# Patient Record
Sex: Male | Born: 1975 | Race: White | Hispanic: No | Marital: Single
Health system: Southern US, Community
[De-identification: ages and names within clinical notes are randomized; demographics above are authoritative.]

## PROBLEM LIST (undated history)

## (undated) HISTORY — PX: CHOLECYSTECTOMY: SHX55

---

## 2021-04-01 ENCOUNTER — Encounter (HOSPITAL_COMMUNITY): Payer: Self-pay | Admitting: Emergency Medicine

## 2021-04-01 ENCOUNTER — Other Ambulatory Visit: Payer: Self-pay

## 2021-04-01 ENCOUNTER — Emergency Department (HOSPITAL_COMMUNITY)
Admission: EM | Admit: 2021-04-01 | Discharge: 2021-04-02 | Disposition: A | Discharge status: Home / Self Care | Payer: BC Managed Care – PPO | Attending: Emergency Medicine | Admitting: Emergency Medicine

## 2021-04-01 ENCOUNTER — Emergency Department (HOSPITAL_COMMUNITY): Payer: BC Managed Care – PPO

## 2021-04-01 DIAGNOSIS — K529 Noninfective gastroenteritis and colitis, unspecified: Secondary | ICD-10-CM | POA: Diagnosis not present

## 2021-04-01 DIAGNOSIS — R739 Hyperglycemia, unspecified: Secondary | ICD-10-CM | POA: Insufficient documentation

## 2021-04-01 DIAGNOSIS — E860 Dehydration: Secondary | ICD-10-CM | POA: Insufficient documentation

## 2021-04-01 DIAGNOSIS — R109 Unspecified abdominal pain: Secondary | ICD-10-CM | POA: Diagnosis present

## 2021-04-01 DIAGNOSIS — E876 Hypokalemia: Secondary | ICD-10-CM | POA: Diagnosis not present

## 2021-04-01 DIAGNOSIS — R000 Tachycardia, unspecified: Secondary | ICD-10-CM | POA: Insufficient documentation

## 2021-04-01 LAB — CBC
HCT: 42.4 % (ref 39.0–52.0)
Hemoglobin: 14.9 g/dL (ref 13.0–17.0)
MCH: 31.6 pg (ref 26.0–34.0)
MCHC: 35.1 g/dL (ref 30.0–36.0)
MCV: 90 fL (ref 80.0–100.0)
Platelets: 303 10*3/uL (ref 150–400)
RBC: 4.71 MIL/uL (ref 4.22–5.81)
RDW: 12.2 % (ref 11.5–15.5)
WBC: 20.2 10*3/uL — ABNORMAL HIGH (ref 4.0–10.5)
nRBC: 0 % (ref 0.0–0.2)

## 2021-04-01 LAB — COMPREHENSIVE METABOLIC PANEL
ALT: 22 U/L (ref 0–44)
AST: 27 U/L (ref 15–41)
Albumin: 4.5 g/dL (ref 3.5–5.0)
Alkaline Phosphatase: 65 U/L (ref 38–126)
Anion gap: 15 (ref 5–15)
BUN: 24 mg/dL — ABNORMAL HIGH (ref 6–20)
CO2: 14 mmol/L — ABNORMAL LOW (ref 22–32)
Calcium: 9.2 mg/dL (ref 8.9–10.3)
Chloride: 108 mmol/L (ref 98–111)
Creatinine, Ser: 1.41 mg/dL — ABNORMAL HIGH (ref 0.61–1.24)
GFR, Estimated: >60 mL/min (ref >60)
Glucose, Bld: 422 mg/dL — ABNORMAL HIGH (ref 70–99)
Potassium: 4.2 mmol/L (ref 3.5–5.1)
Sodium: 137 mmol/L (ref 135–145)
Total Bilirubin: 1.5 mg/dL — ABNORMAL HIGH (ref 0.3–1.2)
Total Protein: 8.3 g/dL — ABNORMAL HIGH (ref 6.5–8.1)

## 2021-04-01 LAB — LIPASE, BLOOD: Lipase: 33 U/L (ref 11–51)

## 2021-04-01 MED ORDER — INSULIN ASPART 100 UNIT/ML IJ SOLN
10.0000 [IU] | Freq: Once | INTRAMUSCULAR | Status: AC
  Administered 2021-04-01: 10 [IU] via SUBCUTANEOUS
  Filled 2021-04-01: qty 0.1

## 2021-04-01 MED ORDER — SODIUM CHLORIDE 0.9 % IV BOLUS
1000.0000 mL | Freq: Once | INTRAVENOUS | Status: AC
  Administered 2021-04-01: 1000 mL via INTRAVENOUS

## 2021-04-01 MED ORDER — MORPHINE SULFATE (PF) 4 MG/ML IV SOLN
4.0000 mg | Freq: Once | INTRAVENOUS | Status: AC
  Administered 2021-04-01: 4 mg via INTRAVENOUS
  Filled 2021-04-01: qty 1

## 2021-04-01 MED ORDER — ONDANSETRON 4 MG PO TBDP
4.0000 mg | ORAL_TABLET | Freq: Once | ORAL | Status: AC
  Administered 2021-04-01: 4 mg via ORAL
  Filled 2021-04-01: qty 1

## 2021-04-01 MED ORDER — IOHEXOL 300 MG/ML  SOLN
100.0000 mL | Freq: Once | INTRAMUSCULAR | Status: AC | PRN
  Administered 2021-04-01: 100 mL via INTRAVENOUS

## 2021-04-01 NOTE — ED Triage Notes (Addendum)
Arrives via EMS from home, reports abdominal pain around umbilicus since 7am, emesis since 9am, has progressed throughout the day. HTN and tachy w/ EMS upon presentation. Hx of bowel obstructions. Pt reports hx of cannabis hyperemesis, last smoked 2 days ago.   20 G LFA, EMS gave 800 cc NS en route. 4 mg Zofran IV, 200 mcg Fentanyl, BP improved to 150/90.

## 2021-04-01 NOTE — ED Provider Triage Note (Signed)
Emergency Medicine Provider Triage Evaluation Note  Roger Walters , a 46 y.o. adult  was evaluated in triage.  Pt complains of lower left quadrant abdominal pain described as cramping as well as nausea and vomiting started overnight and has been worsening throughout the day.  Patient has a history of cannabinoid hyperemesis syndrome, but he states that he has not had symptoms from his CHS since he discontinued Ozempic.  He last used marijuana 2 days ago, however this does not feel like his typical flareup of CHS. patient tried hot showers which did not relieve his symptoms.  He is feeling very dehydrated.    Review of Systems  Positive: Abdominal pain, nausea, vomiting Negative: Diarrhea  Physical Exam  BP (!) 148/92 (BP Location: Left Arm)    Pulse 95    Temp 98.4 F (36.9 C) (Oral)    Resp 18    Ht 5\' 9"  (1.753 m)    Wt 93 kg    SpO2 95%    BMI 30.27 kg/m  Gen:   Awake, no distress   Resp:  Normal effort lungs CTA bilaterally MSK:   Moves extremities without difficulty  Other:    Medical Decision Making  Medically screening exam initiated at 7:04 PM.  Appropriate orders placed.  Geroge Gilliam was informed that the remainder of the evaluation will be completed by another provider, this initial triage assessment does not replace that evaluation, and the importance of remaining in the ED until their evaluation is complete.     Earnestine Mealing, Janell Quiet 04/01/21 1907

## 2021-04-01 NOTE — ED Provider Notes (Signed)
[facility] DEPT Provider Note   CSN: [id number] Arrival date & time: 04/01/21  1831     History  Chief Complaint  Patient presents with   Abdominal Pain   Emesis    Eliseo Vandeputte is a 46 y.o. adult.  The history is provided by the patient, the EMS personnel and medical records. No language interpreter was used.  Abdominal Pain Associated symptoms: vomiting   Emesis Associated symptoms: abdominal pain    46 year old male to male patient with significant history of marijuana abuse, prior cholecystectomy, brought here via EMS from home with complaints of abdominal pain.  Patient Dors severe abdominal pain that started early this morning.  Pain is sharp throbbing achy and crampy in his mid abdomen and has spread throughout the abdomen.  Pain associate with nausea vomiting and diarrhea.  Patient states he feels really dry.  No fever chills no chest pain shortness of breath no dysuria.  Does have history of marijuana abuse and last marijuana use was 2 days ago.  In the triage note mentioned that patient has history of small bowel obstruction but patient denies.  He still has an intact appendix.  EMS did provide patient with Zofran and 200 mcg of fentanyl prior to arrival as well as 800 cc of normal saline.  Home Medications Prior to Admission medications   Not on File      Allergies    Penicillins    Review of Systems   Review of Systems  Gastrointestinal:  Positive for abdominal pain and vomiting.  All other systems reviewed and are negative.  Physical Exam Updated Vital Signs BP (!) 189/126    Pulse (!) 118    Temp 98.4 F (36.9 C) (Oral)    Resp 20    Ht 5\' 9"  (1.753 m)    Wt 93 kg    SpO2 98%    BMI 30.27 kg/m  Physical Exam Constitutional:      Appearance: She is well-developed.     Comments: Patient appears uncomfortable, writhing around in bed requesting for something to drink.  HENT:     Head: Atraumatic.     Mouth/Throat:      Mouth: Mucous membranes are dry.  Eyes:     Conjunctiva/sclera: Conjunctivae normal.  Cardiovascular:     Rate and Rhythm: Tachycardia present.     Pulses: Normal pulses.     Heart sounds: Normal heart sounds.  Pulmonary:     Effort: Pulmonary effort is normal.     Breath sounds: Normal breath sounds.  Abdominal:     Tenderness: There is abdominal tenderness (Diffuse abdominal tenderness, no significant pain at McBurney's point).  Musculoskeletal:     Cervical back: Normal range of motion and neck supple.  Skin:    Findings: No rash.  Neurological:     Mental Status: She is alert. Mental status is at baseline.    ED Results / Procedures / Treatments   Labs (all labs ordered are listed, but only abnormal results are displayed) Labs Reviewed  COMPREHENSIVE METABOLIC PANEL - Abnormal; Notable for the following components:      Result Value   CO2 14 (*)    Glucose, Bld 422 (*)    BUN 24 (*)    Creatinine, Ser 1.41 (*)    Total Protein 8.3 (*)    Total Bilirubin 1.5 (*)    All other components within normal limits  CBC - Abnormal; Notable for the following components:   WBC 20.2 (*)  All other components within normal limits  LIPASE, BLOOD  URINALYSIS, ROUTINE W REFLEX MICROSCOPIC  COMPREHENSIVE METABOLIC PANEL    EKG EKG Interpretation  Date/Time:  Tuesday April 02 2021 00:03:54 EST Ventricular Rate:  100 PR Interval:  148 QRS Duration: 99 QT Interval:  374 QTC Calculation: 483 R Axis:   60 Text Interpretation: Sinus tachycardia Low voltage, precordial leads Borderline prolonged QT interval Confirmed by Ripley Fraise [phone]) on 04/02/2021 1:37:12 AM  Radiology CT ABDOMEN PELVIS W CONTRAST  Result Date: 04/02/2021 CLINICAL DATA:  Acute abdominal pain, left lower quadrant pain. Nausea and vomiting. EXAM: CT ABDOMEN AND PELVIS WITH CONTRAST TECHNIQUE: Multidetector CT imaging of the abdomen and pelvis was performed using the standard protocol following bolus  administration of intravenous contrast. RADIATION DOSE REDUCTION: This exam was performed according to the departmental dose-optimization program which includes automated exposure control, adjustment of the mA and/or kV according to patient size and/or use of iterative reconstruction technique. CONTRAST:  135mL OMNIPAQUE IOHEXOL 300 MG/ML  SOLN COMPARISON:  None. FINDINGS: Lower chest: No acute abnormality. Hepatobiliary: No focal liver abnormality is seen. Status post cholecystectomy. No biliary dilatation. Pancreas: Unremarkable. No pancreatic ductal dilatation or surrounding inflammatory changes. Spleen: Normal in size without focal abnormality. Adrenals/Urinary Tract: There is a rounded hypodensity in the superior pole the left kidney which is too small to characterize, most likely a cyst. Otherwise, kidneys, adrenal glands and bladder are within normal limits. Stomach/Bowel: There is questionable mild diffuse colonic wall thickening versus normal under distension. Small bowel and stomach are within normal limits. Appendix is within normal limits. Vascular/Lymphatic: Aortic atherosclerosis. No enlarged abdominal or pelvic lymph nodes. Reproductive: Prostate is unremarkable. Other: No abdominal wall hernia or abnormality. No abdominopelvic ascites. Musculoskeletal: No acute or significant osseous findings. IMPRESSION: 1. Questionable mild diffuse colonic wall thickening versus normal under distension. Correlate clinically for mild colitis. Electronically Signed   By: Ronney Asters M.D.   On: 04/02/2021 00:08    Procedures Procedures    Medications Ordered in ED Medications  ondansetron (ZOFRAN-ODT) disintegrating tablet 4 mg (4 mg Oral Given 04/01/21 1856)  sodium chloride 0.9 % bolus 1,000 mL (0 mLs Intravenous Stopped 04/02/21 0133)  morphine 4 MG/ML injection 4 mg (4 mg Intravenous Given 04/01/21 2345)  sodium chloride 0.9 % bolus 1,000 mL (0 mLs Intravenous Stopped 04/02/21 0133)  insulin aspart  (novoLOG) injection 10 Units (10 Units Subcutaneous Given 04/01/21 2346)  iohexol (OMNIPAQUE) 300 MG/ML solution 100 mL (100 mLs Intravenous Contrast Given 04/01/21 2353)  haloperidol lactate (HALDOL) injection 5 mg (5 mg Intravenous Given 04/02/21 0232)  lactated ringers bolus 1,000 mL (0 mLs Intravenous Stopped 04/02/21 0320)  potassium chloride 10 mEq in 100 mL IVPB (0 mEq Intravenous Stopped 04/02/21 0437)  potassium chloride SA (KLOR-CON M) CR tablet 40 mEq (40 mEq Oral Given 04/02/21 0438)    ED Course/ Medical Decision Making/ A&P                           Medical Decision Making Problems Addressed: Colitis: acute illness or injury    Details: -provide sxs treatment Dehydration: acute illness or injury    Details: IVF given, tolerates PO Hyperglycemia: acute illness or injury    Details: -IVF and insulin given with improvement -no DKA Hypokalemia: acute illness or injury    Details: potassium supplementation given  Amount and/or Complexity of Data Reviewed External Data Reviewed: labs, radiology, ECG and notes. Labs: ordered. Radiology: ordered  and independent interpretation performed. Decision-making details documented in ED Course. ECG/medicine tests: ordered and independent interpretation performed. Decision-making details documented in ED Course.  Risk Prescription drug management. Parenteral controlled substances. Decision regarding hospitalization.  Critical Care Total time providing critical care: 30-74 minutes  BP 140/82    Pulse 93    Temp 98.4 F (36.9 C) (Oral)    Resp 16    Ht 5\' 9"  (1.753 m)    Wt 93 kg    SpO2 98%    BMI 30.27 kg/m   11:42 PM This is a 46 year old male to male patient presenting with complaints of abdominal pain and associate nausea vomiting and diarrhea since earlier today.  He appears very uncomfortable writhing in bed.  Vital signs remarkable for hypertension with a blood pressure 189/126.  He is tachycardic with a heart rate of 118.  He  is afebrile and no hypoxia.  He does have significant history of marijuana abuse which I suspect his symptoms could be related to cannabinoid hyperemesis syndrome.  Labs obtained today independently reviewed interpreted by me.  Patient does have evidence of AKI with a BUN 24, creatinine of 1.41 but normal GFR.  He is also hyperglycemic with a CBG of 422 however he has normal anion gap and I have low suspicion for DKA.  He has normal lipase.  White count is elevated at 20.2.  In the setting of significant abdominal discomfort, elevated white count, and periumbilical pain, will also obtain abdominal pelvis CT scan to rule out appendicitis or other acute abdominal pathology.  I will provide patient with IV fluid, insulin, antinausea medication, and pain medication.  Will monitor closely.  12:18 AM CT scan of the abdomen pelvis which was independently visualized and independently interpreted by me.  It does have evidence of mild diffuse colonic wall thickening which may suggest mild colitis however no evidence of appendicitis.  Since patient does endorse nausea vomiting diarrhea, I do suspect this is colitis and will be treated as such.  However due to concerns for potential cannabinoid hyperemesis syndrome, will give Haldol.  EKG obtained by me showing a QTC of 485.  Patient will also receive 10 units of insulin for his elevated CBG.  1:39 AM After receiving treatment with patient report he is feeling much better.  He still endorsed feeling dehydrated but overall states he noticed much of an improvement.  Since his initial bicarb is low at 14, will recheck CMP now that he has received IV fluid and insulin.  Care discussed with Dr. Christy Gentles  2:07 AM Patient did not tolerate p.o. challenge.  Repeat CMP showing a bicarb less than 14.  Patient is now hypokalemic with a potassium of 2.9.  Will give supplementation.  CBG did improve to 273 and renal function is improving as well.  5:54 AM After replenishment of  potassium, patient tolerates p.o., recheck of labs shows normalized potassium of 4.3.  Furthermore bicarb improved to 20.  Since patient tolerates p.o. he is stable for discharge with close outpatient follow-up.  Recommend marijuana cessation as it may contribute to his symptoms.  His myocarditis is likely viral in etiology, will avoid antibiotic at this time.  Antinausea medication prescribed.  Return precaution given.  Social determinants of health includes patient gender identification as well as his polysubstance use which affect his prognosis and treatment plan.         Final Clinical Impression(s) / ED Diagnoses Final diagnoses:  Colitis  Hypokalemia  Hyperglycemia  Dehydration  Rx / DC Orders ED Discharge Orders          Ordered    ondansetron (ZOFRAN) 4 MG tablet  Every 6 hours        04/02/21 0558    potassium chloride SA (KLOR-CON M) 20 MEQ tablet  Daily        04/02/21 0558              Domenic Moras, PA-C 04/02/21 0601    Ripley Fraise, MD 04/02/21 [phone]

## 2021-04-01 NOTE — ED Notes (Addendum)
Pt expressed they prefer to go by the name Roger Walters.

## 2021-04-02 DIAGNOSIS — K529 Noninfective gastroenteritis and colitis, unspecified: Secondary | ICD-10-CM | POA: Diagnosis not present

## 2021-04-02 LAB — COMPREHENSIVE METABOLIC PANEL
ALT: 20 U/L (ref 0–44)
AST: 24 U/L (ref 15–41)
Albumin: 3.4 g/dL — ABNORMAL LOW (ref 3.5–5.0)
Alkaline Phosphatase: 48 U/L (ref 38–126)
Anion gap: 9 (ref 5–15)
BUN: 21 mg/dL — ABNORMAL HIGH (ref 6–20)
CO2: 14 mmol/L — ABNORMAL LOW (ref 22–32)
Calcium: 6.9 mg/dL — ABNORMAL LOW (ref 8.9–10.3)
Chloride: 118 mmol/L — ABNORMAL HIGH (ref 98–111)
Creatinine, Ser: 1.15 mg/dL (ref 0.61–1.24)
GFR, Estimated: >60 mL/min (ref >60)
Glucose, Bld: 273 mg/dL — ABNORMAL HIGH (ref 70–99)
Potassium: 2.9 mmol/L — ABNORMAL LOW (ref 3.5–5.1)
Sodium: 141 mmol/L (ref 135–145)
Total Bilirubin: 0.6 mg/dL (ref 0.3–1.2)
Total Protein: 6.4 g/dL — ABNORMAL LOW (ref 6.5–8.1)

## 2021-04-02 LAB — I-STAT CHEM 8, ED
BUN: 19 mg/dL (ref 6–20)
Calcium, Ion: 1.14 mmol/L — ABNORMAL LOW (ref 1.15–1.40)
Chloride: 111 mmol/L (ref 98–111)
Creatinine, Ser: 1.1 mg/dL (ref 0.61–1.24)
Glucose, Bld: 247 mg/dL — ABNORMAL HIGH (ref 70–99)
HCT: 38 % — ABNORMAL LOW (ref 39.0–52.0)
Hemoglobin: 12.9 g/dL — ABNORMAL LOW (ref 13.0–17.0)
Potassium: 4.3 mmol/L (ref 3.5–5.1)
Sodium: 142 mmol/L (ref 135–145)
TCO2: 20 mmol/L — ABNORMAL LOW (ref 22–32)

## 2021-04-02 LAB — URINALYSIS, ROUTINE W REFLEX MICROSCOPIC
Bacteria, UA: NONE SEEN
Bilirubin Urine: NEGATIVE
Glucose, UA: >=500 mg/dL — AB
Ketones, ur: 80 mg/dL — AB
Leukocytes,Ua: NEGATIVE
Nitrite: NEGATIVE
Protein, ur: NEGATIVE mg/dL
Specific Gravity, Urine: >1.046 — ABNORMAL HIGH (ref 1.005–1.030)
pH: 5 (ref 5.0–8.0)

## 2021-04-02 MED ORDER — ONDANSETRON HCL 4 MG PO TABS: 4.0000 mg | ORAL_TABLET | Freq: Four times a day (QID) | ORAL | 0 refills | Status: AC

## 2021-04-02 MED ORDER — POTASSIUM CHLORIDE CRYS ER 20 MEQ PO TBCR: 20.0000 meq | EXTENDED_RELEASE_TABLET | Freq: Every day | ORAL | 0 refills | Status: AC

## 2021-04-02 MED ORDER — POTASSIUM CHLORIDE CRYS ER 20 MEQ PO TBCR
40.0000 meq | EXTENDED_RELEASE_TABLET | Freq: Once | ORAL | Status: AC
  Administered 2021-04-02: 40 meq via ORAL
  Filled 2021-04-02: qty 2

## 2021-04-02 MED ORDER — HALOPERIDOL LACTATE 5 MG/ML IJ SOLN
5.0000 mg | Freq: Once | INTRAMUSCULAR | Status: AC
  Administered 2021-04-02: 5 mg via INTRAVENOUS
  Filled 2021-04-02: qty 1

## 2021-04-02 MED ORDER — POTASSIUM CHLORIDE 10 MEQ/100ML IV SOLN
10.0000 meq | INTRAVENOUS | Status: AC
  Administered 2021-04-02 (×3): 10 meq via INTRAVENOUS
  Filled 2021-04-02 (×3): qty 100

## 2021-04-02 MED ORDER — LACTATED RINGERS IV BOLUS
1000.0000 mL | Freq: Once | INTRAVENOUS | Status: AC
  Administered 2021-04-02: 1000 mL via INTRAVENOUS

## 2021-04-02 NOTE — Discharge Instructions (Addendum)
You have been evaluated for your symptoms.  Your abdominal discomfort is likely due to colitis of which is most likely due to a viral infection.  Take Zofran as needed for nausea.  Please avoid marijuana use as it may worsen your symptoms.  Your blood sugar is elevated today, check your medication appropriately and monitor your blood sugar closely.  Please take potassium supplementation and have your potassium rechecked by your primary care doctor.  Return to the ER if you have any concern.

## 2021-04-02 NOTE — ED Notes (Signed)
Pt is sleeping in the room waiting on his ride at about 7am

## 2021-06-02 ENCOUNTER — Other Ambulatory Visit: Payer: Self-pay

## 2021-06-02 ENCOUNTER — Emergency Department (HOSPITAL_COMMUNITY)
Admission: EM | Admit: 2021-06-02 | Discharge: 2021-06-02 | Disposition: A | Discharge status: Home / Self Care | Payer: BC Managed Care – PPO | Attending: Emergency Medicine | Admitting: Emergency Medicine

## 2021-06-02 DIAGNOSIS — R1084 Generalized abdominal pain: Secondary | ICD-10-CM | POA: Diagnosis not present

## 2021-06-02 DIAGNOSIS — R112 Nausea with vomiting, unspecified: Secondary | ICD-10-CM

## 2021-06-02 DIAGNOSIS — R739 Hyperglycemia, unspecified: Secondary | ICD-10-CM | POA: Insufficient documentation

## 2021-06-02 DIAGNOSIS — E871 Hypo-osmolality and hyponatremia: Secondary | ICD-10-CM | POA: Insufficient documentation

## 2021-06-02 DIAGNOSIS — R748 Abnormal levels of other serum enzymes: Secondary | ICD-10-CM | POA: Insufficient documentation

## 2021-06-02 DIAGNOSIS — E860 Dehydration: Secondary | ICD-10-CM | POA: Diagnosis not present

## 2021-06-02 LAB — URINALYSIS, ROUTINE W REFLEX MICROSCOPIC
Bacteria, UA: NONE SEEN
Bilirubin Urine: NEGATIVE
Glucose, UA: >=500 mg/dL — AB
Hgb urine dipstick: NEGATIVE
Ketones, ur: 20 mg/dL — AB
Nitrite: NEGATIVE
Protein, ur: NEGATIVE mg/dL
Specific Gravity, Urine: 1.021 (ref 1.005–1.030)
pH: 6 (ref 5.0–8.0)

## 2021-06-02 LAB — COMPREHENSIVE METABOLIC PANEL
ALT: 16 U/L (ref 0–44)
AST: 22 U/L (ref 15–41)
Albumin: 4.4 g/dL (ref 3.5–5.0)
Alkaline Phosphatase: 71 U/L (ref 38–126)
Anion gap: 10 (ref 5–15)
BUN: 22 mg/dL — ABNORMAL HIGH (ref 6–20)
CO2: 22 mmol/L (ref 22–32)
Calcium: 9.3 mg/dL (ref 8.9–10.3)
Chloride: 101 mmol/L (ref 98–111)
Creatinine, Ser: 1.24 mg/dL (ref 0.61–1.24)
GFR, Estimated: >60 mL/min (ref >60)
Glucose, Bld: 352 mg/dL — ABNORMAL HIGH (ref 70–99)
Potassium: 4.2 mmol/L (ref 3.5–5.1)
Sodium: 133 mmol/L — ABNORMAL LOW (ref 135–145)
Total Bilirubin: 1.5 mg/dL — ABNORMAL HIGH (ref 0.3–1.2)
Total Protein: 8 g/dL (ref 6.5–8.1)

## 2021-06-02 LAB — CBG MONITORING, ED
Glucose-Capillary: 274 mg/dL — ABNORMAL HIGH (ref 70–99)
Glucose-Capillary: 292 mg/dL — ABNORMAL HIGH (ref 70–99)

## 2021-06-02 LAB — CBC
HCT: 42 % (ref 39.0–52.0)
Hemoglobin: 14.8 g/dL (ref 13.0–17.0)
MCH: 31.2 pg (ref 26.0–34.0)
MCHC: 35.2 g/dL (ref 30.0–36.0)
MCV: 88.6 fL (ref 80.0–100.0)
Platelets: 386 10*3/uL (ref 150–400)
RBC: 4.74 MIL/uL (ref 4.22–5.81)
RDW: 12 % (ref 11.5–15.5)
WBC: 12.7 10*3/uL — ABNORMAL HIGH (ref 4.0–10.5)
nRBC: 0 % (ref 0.0–0.2)

## 2021-06-02 LAB — LIPASE, BLOOD: Lipase: 331 U/L — ABNORMAL HIGH (ref 11–51)

## 2021-06-02 MED ORDER — SODIUM CHLORIDE 0.9 % IV BOLUS
1000.0000 mL | Freq: Once | INTRAVENOUS | Status: AC
  Administered 2021-06-02: 1000 mL via INTRAVENOUS

## 2021-06-02 MED ORDER — LORAZEPAM 2 MG/ML IJ SOLN
1.0000 mg | Freq: Once | INTRAMUSCULAR | Status: AC
  Administered 2021-06-02: 1 mg via INTRAVENOUS
  Filled 2021-06-02: qty 1

## 2021-06-02 NOTE — ED Notes (Addendum)
Patient ambulated to the bathroom. Urine specimen collected. JRPRN ?

## 2021-06-02 NOTE — ED Notes (Signed)
Pt drink all the water.  ?

## 2021-06-02 NOTE — ED Provider Notes (Signed)
?[facility]-EMERGENCY DEPT ?Provider Note ? ? ?CSN: [id number] ?Arrival date & time: 06/02/21  0930 ? ?  ? ?History ? ?Chief Complaint  ?Patient presents with  ? Nausea  ? Abdominal Pain  ?  Complains of nausea, vomiting, and abdominal pain. Started last night per patient.   ? Emesis  ? ? ?Roger Walters is a 46 y.o. adult. ? ? ?Abdominal Pain ?Associated symptoms: vomiting   ?Emesis ?Associated symptoms: abdominal pain   ? ?Patient is a 46 year old trans male presented to the emergency room today with complaints of nausea vomiting abdominal pain seems that her symptoms started yesterday evening she states that she has had approximately 7 episodes of vomiting nonbloody nonbilious.  States that her abdomen is hurting generally all over. ? ?She states that she smokes weed in the morning immediately after waking up and throughout the day and just before bedtime.  She states that she has a history of cannabinoid hyperemesis and that this feels similar to flareups that she has had in the past. ?She states that she has investigated other options besides marijuana but feels that this is one of the few ways that she can control/manage her chronic mental health issues which primarily centered around suicidality.  She does not feel particularly suicidal today, denies suicidality, homicidality, hallucinations ? ? ?  ? ?Home Medications ?Prior to Admission medications   ?Medication Sig Start Date End Date Taking? Authorizing Provider  ?ondansetron (ZOFRAN) 4 MG tablet Take 1 tablet (4 mg total) by mouth every 6 (six) hours. 04/02/21   Fayrene Helper, PA-C  ?potassium chloride SA (KLOR-CON M) 20 MEQ tablet Take 1 tablet (20 mEq total) by mouth daily. 04/02/21   Fayrene Helper, PA-C  ?   ? ?Allergies    ?Penicillins   ? ?Review of Systems   ?Review of Systems  ?Gastrointestinal:  Positive for abdominal pain and vomiting.  ? ?Physical Exam ?Updated Vital Signs ?BP (!) 177/111 (BP Location: Right Arm) Comment (BP  Location): Simultaneous filing. User may not have seen previous data.  Pulse 97 Comment: Simultaneous filing. User may not have seen previous data.  Temp 97.6 ?F (36.4 ?C) (Oral) Comment: Simultaneous filing. User may not have seen previous data. Comment (Src): Simultaneous filing. User may not have seen previous data.  Resp 20 Comment: Simultaneous filing. User may not have seen previous data.  Ht 5\' 9"  (1.753 m)   Wt 95.3 kg   SpO2 95% Comment: Simultaneous filing. User may not have seen previous data.  BMI 31.01 kg/m?  ?Physical Exam ?Vitals and nursing note reviewed.  ?Constitutional:   ?   General: She is not in acute distress. ?   Comments: Patient is 46 year old male, nontoxic-appearing, able answer questions appropriately and follow commands  ?HENT:  ?   Head: Normocephalic and atraumatic.  ?   Nose: Nose normal.  ?   Mouth/Throat:  ?   Mouth: Mucous membranes are dry.  ?Eyes:  ?   General: No scleral icterus. ?Cardiovascular:  ?   Rate and Rhythm: Normal rate and regular rhythm.  ?   Pulses: Normal pulses.  ?   Heart sounds: Normal heart sounds.  ?Pulmonary:  ?   Effort: Pulmonary effort is normal. No respiratory distress.  ?   Breath sounds: No wheezing.  ?Abdominal:  ?   Palpations: Abdomen is soft.  ?   Tenderness: There is abdominal tenderness. There is no guarding or rebound.  ?   Comments: Abd is soft, there is  diffuse tenderness seems to be epigastric left upper quadrant and umbilical in location.  No guarding or rebound.  ?Musculoskeletal:  ?   Cervical back: Normal range of motion.  ?   Right lower leg: No edema.  ?   Left lower leg: No edema.  ?Skin: ?   General: Skin is warm and dry.  ?   Capillary Refill: Capillary refill takes less than 2 seconds.  ?Neurological:  ?   Mental Status: She is alert. Mental status is at baseline.  ?Psychiatric:     ?   Mood and Affect: Mood normal.     ?   Behavior: Behavior normal.  ? ? ?ED Results / Procedures / Treatments   ?Labs ?(all labs ordered are  listed, but only abnormal results are displayed) ?Labs Reviewed  ?LIPASE, BLOOD  ?COMPREHENSIVE METABOLIC PANEL  ?CBC  ?URINALYSIS, ROUTINE W REFLEX MICROSCOPIC  ? ? ?EKG ?None ? ?Radiology ?No results found. ? ?Procedures ?Procedures  ? ? ?Medications Ordered in ED ?Medications  ?sodium chloride 0.9 % bolus 1,000 mL (1,000 mLs Intravenous New Bag/Given 06/02/21 1049)  ?LORazepam (ATIVAN) injection 1 mg (1 mg Intravenous Given 06/02/21 1048)  ? ? ?ED Course/ Medical Decision Making/ A&P ?Clinical Course as of 06/02/21 1053  ?Wynelle Link Jun 02, 2021  ?[id number] Since last night. NV + abd pain  [WF]  ?  ?Clinical Course User Index ?[WF] Gailen Shelter, [place]  ? ?                        ?Medical Decision Making ?Amount and/or Complexity of Data Reviewed ?Labs: ordered. ? ?Risk ?Prescription drug management. ? ? ?This patient presents to the ED for concern of abdominal pain nausea and vomiting, this involves a number of treatment options, and is a complaint that carries with it a high risk of complications and morbidity.  The differential diagnosis includes The causes of generalized abdominal pain include but are not limited to AAA, mesenteric ischemia, appendicitis, diverticulitis, DKA, gastritis, gastroenteritis, AMI, nephrolithiasis, pancreatitis, peritonitis, adrenal insufficiency,lead poisoning, iron toxicity, intestinal ischemia, constipation, UTI,SBO/LBO, splenic rupture, biliary disease, IBD, IBS, PUD, or hepatitis. ?Ectopic pregnancy, ovarian torsion, PID. ? ? ? ?Co morbidities: ?Discussed in HPI ? ? ?Brief History: ? ?Patient is a 46 year old trans male presented to the emergency room today with complaints of nausea vomiting abdominal pain seems that her symptoms started yesterday evening she states that she has had approximately 7 episodes of vomiting nonbloody nonbilious.  States that her abdomen is hurting generally all over. ? ?She states that she smokes weed in the morning immediately after waking up and throughout the  day and just before bedtime.  She states that she has a history of cannabinoid hyperemesis and that this feels similar to flareups that she has had in the past. ?She states that she has investigated other options besides marijuana but feels that this is one of the few ways that she can control/manage her chronic mental health issues which primarily centered around suicidality.  She does not feel particularly suicidal today, denies suicidality, homicidality, hallucinations ? ? ? ? ? ?EMR reviewed including pt PMHx, past surgical history and past visits to ER.  ? ?See HPI for more details ? ? ?Lab Tests: ? ? ?I ordered and independently interpreted labs. Labs notable for mild elevation in lipase no epigastric tenderness or right upper quadrant tenderness.  Mild hyponatremia corrects to normal with correction for hyperglycemia.  No anion gap.  Urinalysis  without evidence of infection. ? ? ?Imaging Studies: ? ?No imaging studies ordered for this patient ? ? ? ?Cardiac Monitoring: ? ?The patient was maintained on a cardiac monitor.  I personally viewed and interpreted the cardiac monitored which showed an underlying rhythm of: NSR ?NA ? ? ?Medicines ordered: ? ?I ordered medication including Ativan, 1 L normal saline for nausea, vomiting, dehydration ?Reevaluation of the patient after these medicines showed that the patient resolved ?I have reviewed the patients home medicines and have made adjustments as needed ? ? ?Critical Interventions: ? ? ? ? ?Consults/Attending Physician ? ? ? ? ? ?Reevaluation: ? ?After the interventions noted above I re-evaluated patient and found that they have :resolved ? ? ?Social Determinants of Health: ? ?The patient's social determinants of health were a factor in the care of this patient ? ? ? ?Problem List / ED Course: ? ?Patient tells me that this feels like cannabinoid hyperemesis which is an issue she suffers from. ?Ultimately her abdominal exam is benign, she is tolerating p.o.   Well-appearing on exam.  Does have a slight elevation in lipase.  Return precautions were discussed.  Symptoms not consistent with biliary disease she has no right upper quadrant tenderness negative Murphy sign. ? ?I

## 2021-06-02 NOTE — ED Triage Notes (Signed)
Complains of nausea, vomiting, and abdominal pain per patient. Started last night.  ?

## 2021-06-02 NOTE — Discharge Instructions (Signed)
Please drink plenty of water.  ?Follow up with your care provider.  Return to the ER for any new or concerning symptoms.  If you do not have a primary care provider please make an appointment with 1.  If you do not have insurance please call the [facility] was information provided to you. ?

## 2023-02-10 IMAGING — CT CT ABD-PELV W/ CM
2 of 5 series · 15 of 46 positions shown, 17 images · IV contrast (agent unspecified)
Comparison: None.

CLINICAL DATA: Acute abdominal pain, left lower quadrant pain.
Nausea and vomiting.

EXAM:
CT ABDOMEN AND PELVIS WITH CONTRAST
TECHNIQUE: Multidetector CT imaging of the abdomen and pelvis was performed
using the standard protocol following bolus administration of
intravenous contrast.

[Series 2: axial st · axial · 0.87mm/px · z∈[-553,-88]mm · 12 of 111 slices shown, 14 images]
[im 9/111  soft-tissue]
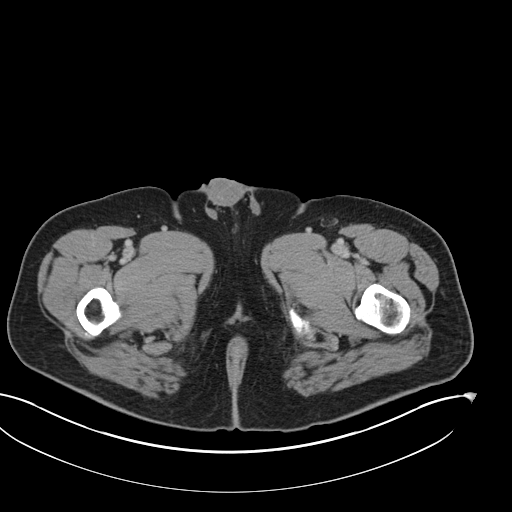
[im 9/111  bone]
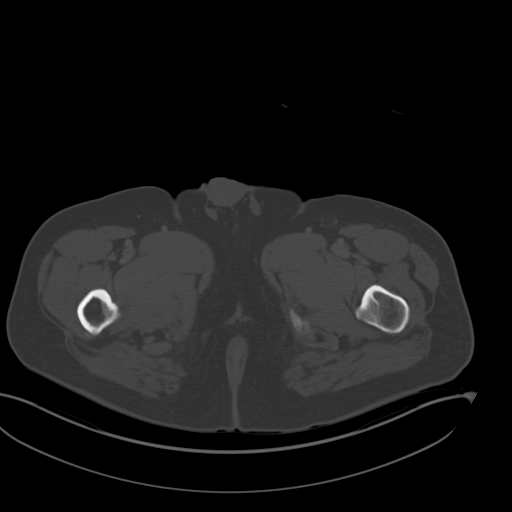
[im 17/111  soft-tissue]
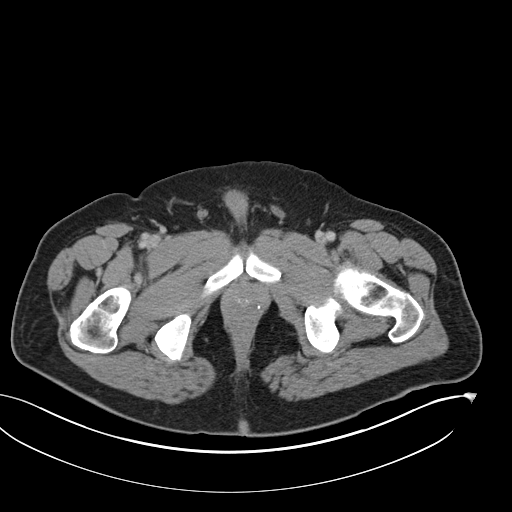
[im 26/111  soft-tissue]
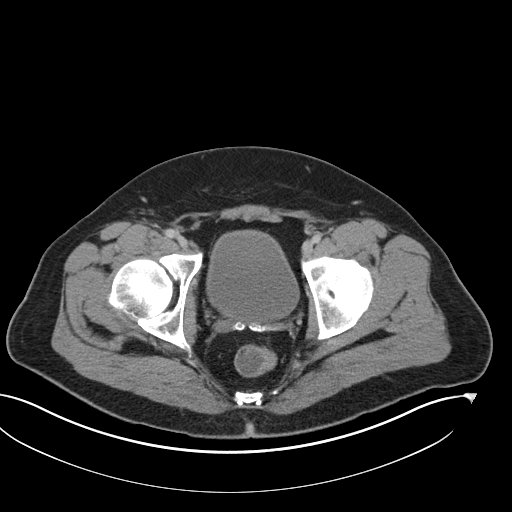
[im 34/111  soft-tissue]
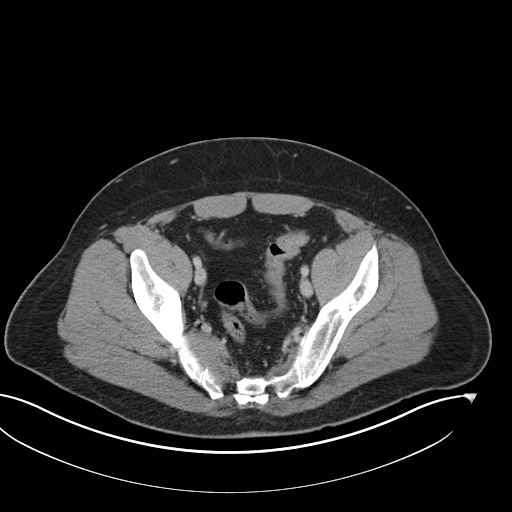
[im 43/111  soft-tissue]
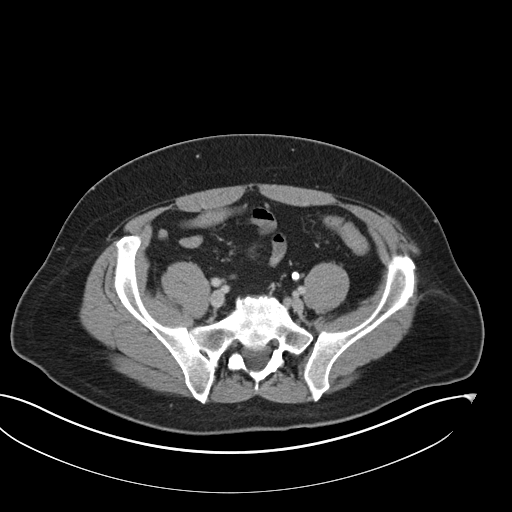
[im 51/111  soft-tissue]
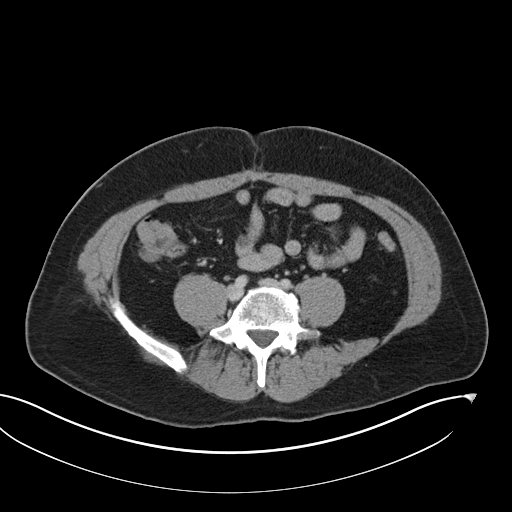
[im 60/111  soft-tissue]
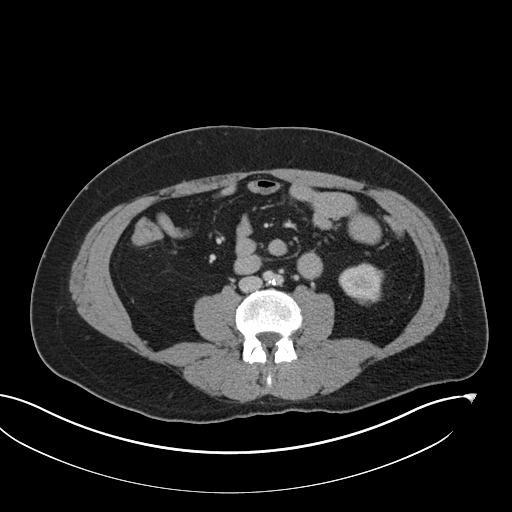
[im 68/111  soft-tissue]
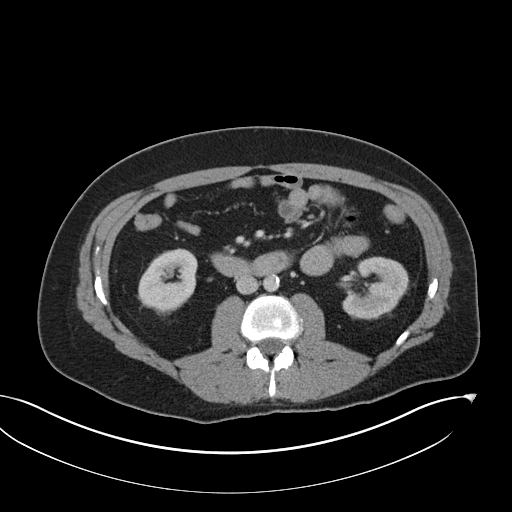
[im 77/111  soft-tissue]
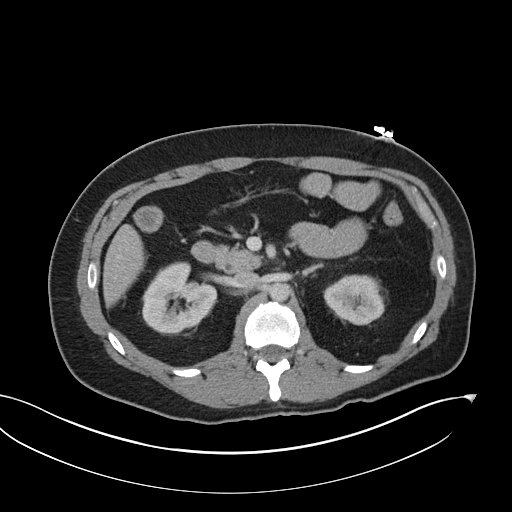
[im 77/111  bone]
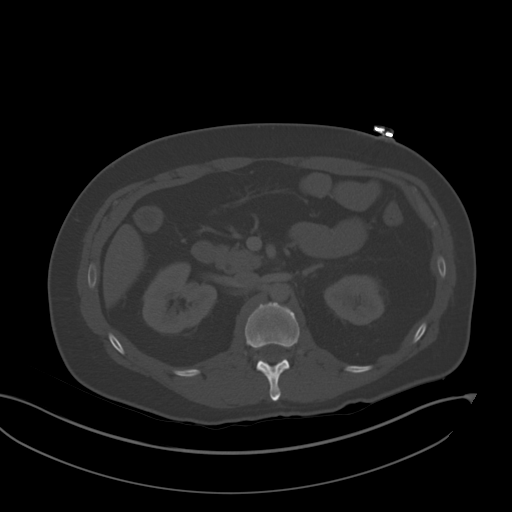
[im 85/111  soft-tissue]
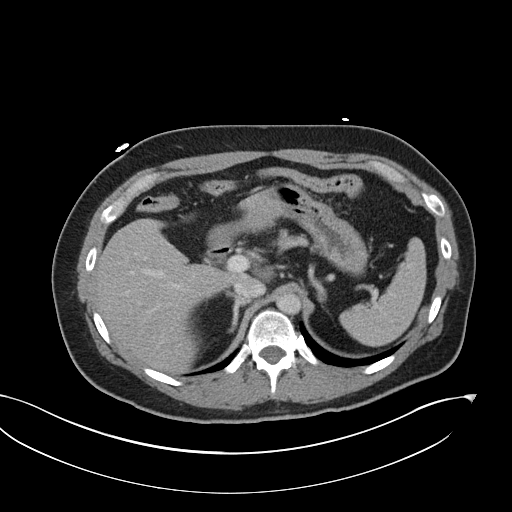
[im 94/111  soft-tissue]
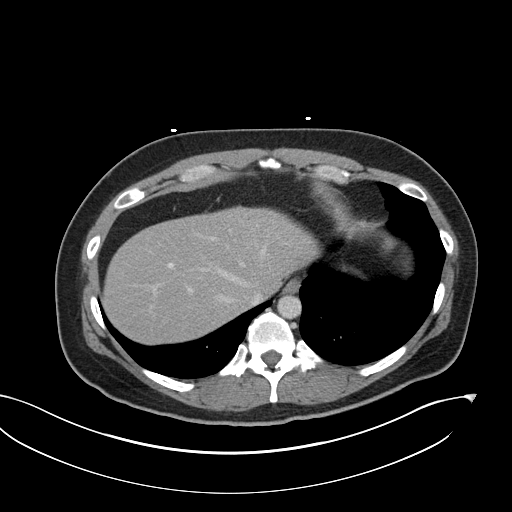
[im 102/111  soft-tissue]
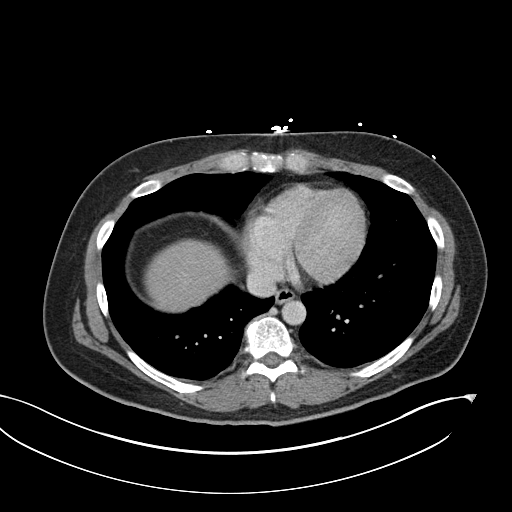

[Series 4: coronal st · coronal · 0.89mm/px · 3 of 133 slices shown]
[im 45/133  soft-tissue]
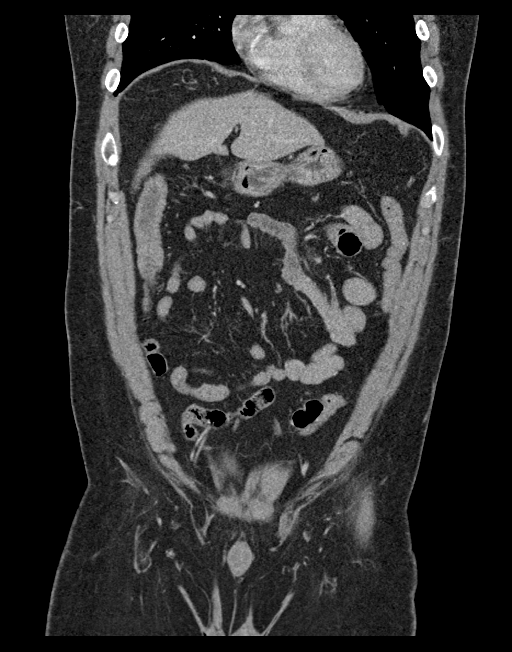
[im 59/133  soft-tissue]
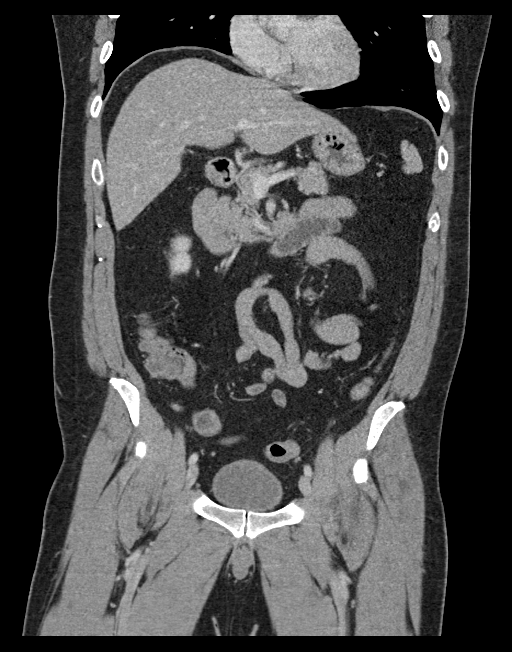
[im 74/133  soft-tissue]
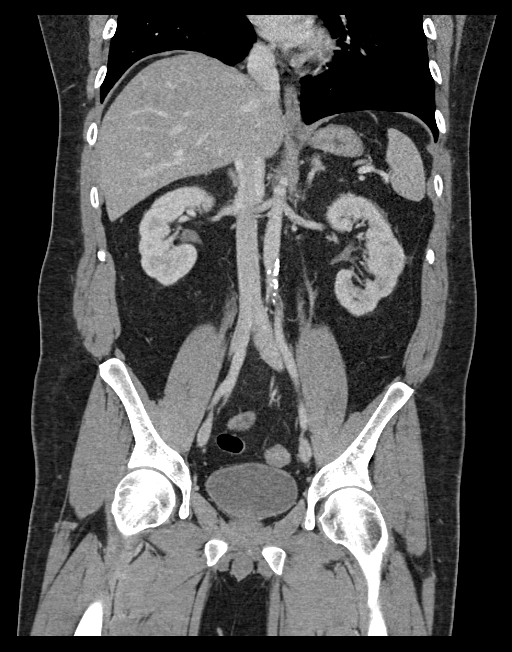

[15 of 46 positions shown; findings below may reference images not displayed]

RADIATION DOSE REDUCTION: This exam was performed according to the
departmental dose-optimization program which includes automated
exposure control, adjustment of the mA and/or kV according to
patient size and/or use of iterative reconstruction technique.

CONTRAST:  100mL OMNIPAQUE IOHEXOL 300 MG/ML  SOLN
FINDINGS: Lower chest: No acute abnormality.

Hepatobiliary: No focal liver abnormality is seen. Status post
cholecystectomy. No biliary dilatation.

Pancreas: Unremarkable. No pancreatic ductal dilatation or
surrounding inflammatory changes.

Spleen: Normal in size without focal abnormality.

Adrenals/Urinary Tract: There is a rounded hypodensity in the
superior pole the left kidney which is too small to characterize,
most likely a cyst. Otherwise, kidneys, adrenal glands and bladder
are within normal limits.

Stomach/Bowel: There is questionable mild diffuse colonic wall
thickening versus normal under distension. Small bowel and stomach
are within normal limits. Appendix is within normal limits.

Vascular/Lymphatic: Aortic atherosclerosis. No enlarged abdominal or
pelvic lymph nodes.

Reproductive: Prostate is unremarkable.

Other: No abdominal wall hernia or abnormality. No abdominopelvic
ascites.

Musculoskeletal: No acute or significant osseous findings.
IMPRESSION: 1. Questionable mild diffuse colonic wall thickening versus normal
under distension. Correlate clinically for mild colitis.
